# Patient Record
Sex: Male | Born: 2000 | Race: Black or African American | Hispanic: No | Marital: Single
Health system: Southern US, Community
[De-identification: ages and names within clinical notes are randomized; demographics above are authoritative.]

## PROBLEM LIST (undated history)

## (undated) DIAGNOSIS — R6251 Failure to thrive (child): Secondary | ICD-10-CM

## (undated) DIAGNOSIS — D649 Anemia, unspecified: Secondary | ICD-10-CM

## (undated) HISTORY — PX: HERNIA REPAIR: SHX51

## (undated) HISTORY — PX: COLONOSCOPY: SHX174

---
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
## 2021-04-20 ENCOUNTER — Encounter: Payer: Self-pay | Admitting: Pediatrics

## 2021-07-20 ENCOUNTER — Ambulatory Visit: Payer: Medicaid Other | Admitting: Pediatrics
